# Patient Record
Sex: Male | Born: 1965 | Race: White | Hispanic: No | Marital: Single | Smoking: Never smoker
Health system: Southern US, Community
[De-identification: ages and names within clinical notes are randomized; demographics above are authoritative.]

## PROBLEM LIST (undated history)

## (undated) DIAGNOSIS — I10 Essential (primary) hypertension: Secondary | ICD-10-CM

## (undated) HISTORY — PX: MELANOMA EXCISION: SHX5266

---

## 2008-09-17 ENCOUNTER — Ambulatory Visit: Payer: Self-pay | Admitting: Internal Medicine

## 2013-02-27 ENCOUNTER — Emergency Department: Payer: Self-pay | Admitting: Emergency Medicine

## 2020-04-17 ENCOUNTER — Other Ambulatory Visit
Admission: RE | Admit: 2020-04-17 | Discharge: 2020-04-17 | Disposition: A | Discharge status: Home / Self Care | Payer: BC Managed Care – PPO | Source: Ambulatory Visit | Attending: Surgery | Admitting: Surgery

## 2020-04-17 ENCOUNTER — Other Ambulatory Visit: Payer: Self-pay | Admitting: Surgery

## 2020-04-17 ENCOUNTER — Ambulatory Visit (INDEPENDENT_AMBULATORY_CARE_PROVIDER_SITE_OTHER): Payer: BC Managed Care – PPO

## 2020-04-17 ENCOUNTER — Other Ambulatory Visit: Payer: Self-pay

## 2020-04-17 ENCOUNTER — Ambulatory Visit
Admission: EM | Admit: 2020-04-17 | Discharge: 2020-04-17 | Disposition: A | Discharge status: Home / Self Care | Payer: BC Managed Care – PPO | Attending: Family Medicine | Admitting: Family Medicine

## 2020-04-17 ENCOUNTER — Encounter: Payer: Self-pay | Admitting: Emergency Medicine

## 2020-04-17 DIAGNOSIS — Z79899 Other long term (current) drug therapy: Secondary | ICD-10-CM | POA: Diagnosis not present

## 2020-04-17 DIAGNOSIS — S82841A Displaced bimalleolar fracture of right lower leg, initial encounter for closed fracture: Secondary | ICD-10-CM | POA: Diagnosis not present

## 2020-04-17 DIAGNOSIS — Z01812 Encounter for preprocedural laboratory examination: Secondary | ICD-10-CM | POA: Insufficient documentation

## 2020-04-17 DIAGNOSIS — S82831A Other fracture of upper and lower end of right fibula, initial encounter for closed fracture: Secondary | ICD-10-CM | POA: Diagnosis present

## 2020-04-17 DIAGNOSIS — M25571 Pain in right ankle and joints of right foot: Secondary | ICD-10-CM

## 2020-04-17 DIAGNOSIS — Z20822 Contact with and (suspected) exposure to covid-19: Secondary | ICD-10-CM | POA: Insufficient documentation

## 2020-04-17 HISTORY — DX: Essential (primary) hypertension: I10

## 2020-04-17 LAB — SARS CORONAVIRUS 2 (TAT 6-24 HRS): SARS Coronavirus 2: NEGATIVE

## 2020-04-17 MED ORDER — HYDROCODONE-ACETAMINOPHEN 5-325 MG PO TABS: 1.0000 | ORAL_TABLET | Freq: Three times a day (TID) | ORAL | 0 refills | Status: AC | PRN
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
## 2020-04-17 NOTE — Discharge Instructions (Signed)
Rest. Elevation.  No weight bearing.  Call the Ortho office to get an appt (I spoke with one of the PA's).   Take care  [name]

## 2020-04-18 ENCOUNTER — Ambulatory Visit: Payer: BC Managed Care – PPO | Admitting: Anesthesiology

## 2020-04-18 ENCOUNTER — Ambulatory Visit: Payer: BC Managed Care – PPO

## 2020-04-18 ENCOUNTER — Encounter
Admission: RE | Disposition: A | Discharge status: Home / Self Care | Payer: Self-pay | Source: Home / Self Care | Attending: Surgery

## 2020-04-18 ENCOUNTER — Encounter: Payer: Self-pay | Admitting: Surgery

## 2020-04-18 ENCOUNTER — Other Ambulatory Visit: Payer: Self-pay

## 2020-04-18 ENCOUNTER — Ambulatory Visit
Admission: RE | Admit: 2020-04-18 | Discharge: 2020-04-18 | Disposition: A | Discharge status: Home / Self Care | Payer: BC Managed Care – PPO | Attending: Surgery | Admitting: Surgery

## 2020-04-18 ENCOUNTER — Ambulatory Visit: Payer: Self-pay

## 2020-04-18 DIAGNOSIS — Z79899 Other long term (current) drug therapy: Secondary | ICD-10-CM | POA: Insufficient documentation

## 2020-04-18 DIAGNOSIS — S82831A Other fracture of upper and lower end of right fibula, initial encounter for closed fracture: Secondary | ICD-10-CM | POA: Insufficient documentation

## 2020-04-18 DIAGNOSIS — G8918 Other acute postprocedural pain: Secondary | ICD-10-CM

## 2020-04-18 DIAGNOSIS — Z20822 Contact with and (suspected) exposure to covid-19: Secondary | ICD-10-CM | POA: Insufficient documentation

## 2020-04-18 DIAGNOSIS — Z419 Encounter for procedure for purposes other than remedying health state, unspecified: Secondary | ICD-10-CM

## 2020-04-18 HISTORY — PX: ORIF ANKLE FRACTURE: SHX5408

## 2020-04-18 LAB — BASIC METABOLIC PANEL
Anion gap: 7 (ref 5–15)
BUN: 14 mg/dL (ref 6–20)
CO2: 25 mmol/L (ref 22–32)
Calcium: 8.8 mg/dL — ABNORMAL LOW (ref 8.9–10.3)
Chloride: 102 mmol/L (ref 98–111)
Creatinine, Ser: 0.91 mg/dL (ref 0.61–1.24)
GFR, Estimated: >60 mL/min (ref >60)
Glucose, Bld: 114 mg/dL — ABNORMAL HIGH (ref 70–99)
Potassium: 3.8 mmol/L (ref 3.5–5.1)
Sodium: 134 mmol/L — ABNORMAL LOW (ref 135–145)

## 2020-04-18 SURGERY — OPEN REDUCTION INTERNAL FIXATION (ORIF) ANKLE FRACTURE
Anesthesia: General | Site: Ankle | Laterality: Right

## 2020-04-18 MED ORDER — HYDROCODONE-ACETAMINOPHEN 5-325 MG PO TABS: 1.0000 | ORAL_TABLET | ORAL | Status: DC | PRN

## 2020-04-18 MED ORDER — OXYCODONE HCL 5 MG/5ML PO SOLN: 5.0000 mg | Freq: Once | ORAL | Status: DC | PRN

## 2020-04-18 MED ORDER — BUPIVACAINE HCL 0.5 % IJ SOLN
INTRAMUSCULAR | Status: DC | PRN
  Administered 2020-04-18: 20 mL

## 2020-04-18 MED ORDER — MIDAZOLAM HCL 2 MG/2ML IJ SOLN
INTRAMUSCULAR | Status: AC
  Filled 2020-04-18: qty 2

## 2020-04-18 MED ORDER — MIDAZOLAM HCL 2 MG/2ML IJ SOLN
INTRAMUSCULAR | Status: AC
  Administered 2020-04-18: 2 mg via INTRAVENOUS
  Filled 2020-04-18: qty 2

## 2020-04-18 MED ORDER — OXYCODONE HCL 5 MG PO TABS: 5.0000 mg | ORAL_TABLET | Freq: Once | ORAL | Status: DC | PRN

## 2020-04-18 MED ORDER — PROPOFOL 10 MG/ML IV BOLUS
INTRAVENOUS | Status: AC
  Filled 2020-04-18: qty 20

## 2020-04-18 MED ORDER — FENTANYL CITRATE (PF) 100 MCG/2ML IJ SOLN
INTRAMUSCULAR | Status: AC
  Administered 2020-04-18: 100 ug via INTRAVENOUS
  Filled 2020-04-18: qty 2

## 2020-04-18 MED ORDER — MIDAZOLAM HCL 2 MG/2ML IJ SOLN: 2.0000 mg | Freq: Once | INTRAMUSCULAR | Status: AC

## 2020-04-18 MED ORDER — BUPIVACAINE HCL (PF) 0.5 % IJ SOLN
INTRAMUSCULAR | Status: AC
  Filled 2020-04-18: qty 30

## 2020-04-18 MED ORDER — ORAL CARE MOUTH RINSE: 15.0000 mL | Freq: Once | OROMUCOSAL | Status: AC

## 2020-04-18 MED ORDER — CEFAZOLIN SODIUM-DEXTROSE 2-4 GM/100ML-% IV SOLN
INTRAVENOUS | Status: AC
  Filled 2020-04-18: qty 100

## 2020-04-18 MED ORDER — MEPERIDINE HCL 50 MG/ML IJ SOLN: 6.2500 mg | INTRAMUSCULAR | Status: DC | PRN

## 2020-04-18 MED ORDER — METOCLOPRAMIDE HCL 5 MG/ML IJ SOLN: 5.0000 mg | Freq: Three times a day (TID) | INTRAMUSCULAR | Status: DC | PRN

## 2020-04-18 MED ORDER — ONDANSETRON HCL 4 MG/2ML IJ SOLN: 4.0000 mg | Freq: Four times a day (QID) | INTRAMUSCULAR | Status: DC | PRN

## 2020-04-18 MED ORDER — ONDANSETRON HCL 4 MG PO TABS: 4.0000 mg | ORAL_TABLET | Freq: Four times a day (QID) | ORAL | Status: DC | PRN

## 2020-04-18 MED ORDER — CHLORHEXIDINE GLUCONATE 0.12 % MT SOLN: 15.0000 mL | Freq: Once | OROMUCOSAL | Status: AC

## 2020-04-18 MED ORDER — CHLORHEXIDINE GLUCONATE 0.12 % MT SOLN
OROMUCOSAL | Status: AC
  Administered 2020-04-18: 15 mL via OROMUCOSAL
  Filled 2020-04-18: qty 15

## 2020-04-18 MED ORDER — DROPERIDOL 2.5 MG/ML IJ SOLN
0.6250 mg | Freq: Once | INTRAMUSCULAR | Status: DC | PRN
  Filled 2020-04-18: qty 2

## 2020-04-18 MED ORDER — PROMETHAZINE HCL 25 MG/ML IJ SOLN: 6.2500 mg | INTRAMUSCULAR | Status: DC | PRN

## 2020-04-18 MED ORDER — LACTATED RINGERS IV SOLN: INTRAVENOUS | Status: DC

## 2020-04-18 MED ORDER — LORAZEPAM 2 MG/ML IJ SOLN: 1.0000 mg | Freq: Once | INTRAMUSCULAR | Status: DC | PRN

## 2020-04-18 MED ORDER — LIDOCAINE HCL (CARDIAC) PF 100 MG/5ML IV SOSY
PREFILLED_SYRINGE | INTRAVENOUS | Status: DC | PRN
  Administered 2020-04-18: 100 mg via INTRAVENOUS

## 2020-04-18 MED ORDER — ONDANSETRON HCL 4 MG/2ML IJ SOLN
INTRAMUSCULAR | Status: DC | PRN
  Administered 2020-04-18: 4 mg via INTRAVENOUS

## 2020-04-18 MED ORDER — DEXTROSE 5 % IV SOLN
3.0000 g | INTRAVENOUS | Status: AC
  Administered 2020-04-18: 3 g via INTRAVENOUS
  Filled 2020-04-18: qty 3

## 2020-04-18 MED ORDER — HYDROMORPHONE HCL 1 MG/ML IJ SOLN: 0.2500 mg | INTRAMUSCULAR | Status: DC | PRN

## 2020-04-18 MED ORDER — FAMOTIDINE 20 MG PO TABS
ORAL_TABLET | ORAL | Status: AC
  Administered 2020-04-18: 20 mg via ORAL
  Filled 2020-04-18: qty 1

## 2020-04-18 MED ORDER — FENTANYL CITRATE (PF) 100 MCG/2ML IJ SOLN
INTRAMUSCULAR | Status: AC
  Filled 2020-04-18: qty 2

## 2020-04-18 MED ORDER — METOCLOPRAMIDE HCL 10 MG PO TABS: 5.0000 mg | ORAL_TABLET | Freq: Three times a day (TID) | ORAL | Status: DC | PRN

## 2020-04-18 MED ORDER — PROPOFOL 10 MG/ML IV BOLUS
INTRAVENOUS | Status: DC | PRN
  Administered 2020-04-18: 200 mg via INTRAVENOUS

## 2020-04-18 MED ORDER — POTASSIUM CHLORIDE IN NACL 20-0.9 MEQ/L-% IV SOLN
INTRAVENOUS | Status: DC
  Filled 2020-04-18 (×3): qty 1000

## 2020-04-18 MED ORDER — FENTANYL CITRATE (PF) 100 MCG/2ML IJ SOLN: 100.0000 ug | Freq: Once | INTRAMUSCULAR | Status: AC

## 2020-04-18 MED ORDER — ACETAMINOPHEN 10 MG/ML IV SOLN
INTRAVENOUS | Status: AC
  Filled 2020-04-18: qty 100

## 2020-04-18 MED ORDER — FAMOTIDINE 20 MG PO TABS: 20.0000 mg | ORAL_TABLET | Freq: Once | ORAL | Status: AC

## 2020-04-18 MED ORDER — ACETAMINOPHEN 10 MG/ML IV SOLN
INTRAVENOUS | Status: DC | PRN
  Administered 2020-04-18: 1000 mg via INTRAVENOUS

## 2020-04-18 MED ORDER — ROPIVACAINE HCL 5 MG/ML IJ SOLN
INTRAMUSCULAR | Status: AC
  Filled 2020-04-18: qty 60

## 2020-04-18 SURGICAL SUPPLY — 60 items
2.2mm square driver ×1 IMPLANT
APL PRP STRL LF DISP 70% ISPRP (MISCELLANEOUS) ×2
BIT DRILL 2.5X2.75 QC CALB (BIT) ×1 IMPLANT
BIT DRILL 3.5X5.5 QC CALB (BIT) ×1 IMPLANT
BLADE SURG SZ10 CARB STEEL (BLADE) ×4 IMPLANT
BNDG COHESIVE 4X5 TAN STRL (GAUZE/BANDAGES/DRESSINGS) ×2 IMPLANT
BNDG ELASTIC 4X5.8 VLCR STR LF (GAUZE/BANDAGES/DRESSINGS) ×4 IMPLANT
BNDG ELASTIC 6X5.8 VLCR STR LF (GAUZE/BANDAGES/DRESSINGS) ×2 IMPLANT
BNDG ESMARK 6X12 TAN STRL LF (GAUZE/BANDAGES/DRESSINGS) ×2 IMPLANT
BNDG PLASTER FAST 4X5 WHT LF (CAST SUPPLIES) ×8 IMPLANT
BNDG PLSTR 5X4 FST ST WHT LF (CAST SUPPLIES) ×4
CANISTER SUCT 1200ML W/VALVE (MISCELLANEOUS) ×2 IMPLANT
CHLORAPREP W/TINT 26 (MISCELLANEOUS) ×4 IMPLANT
COVER WAND RF STERILE (DRAPES) ×2 IMPLANT
CUFF TOURN SGL QUICK 24 (TOURNIQUET CUFF) ×2
CUFF TOURN SGL QUICK 30 (TOURNIQUET CUFF)
CUFF TRNQT CYL 24X4X16.5-23 (TOURNIQUET CUFF) IMPLANT
CUFF TRNQT CYL 30X4X21-28X (TOURNIQUET CUFF) IMPLANT
DRAPE C-ARM XRAY 36X54 (DRAPES) ×2 IMPLANT
DRAPE C-ARMOR (DRAPES) ×2 IMPLANT
DRAPE INCISE IOBAN 66X45 STRL (DRAPES) ×2 IMPLANT
DRAPE SPLIT 6X30 W/TAPE (DRAPES) ×2 IMPLANT
DRAPE U-SHAPE 47X51 STRL (DRAPES) ×2 IMPLANT
DRIVER SQUARE 2.2 DISP (INSTRUMENTS) ×1 IMPLANT
ELECT CAUTERY BLADE 6.4 (BLADE) ×2 IMPLANT
ELECT REM PT RETURN 9FT ADLT (ELECTROSURGICAL) ×2
ELECTRODE REM PT RTRN 9FT ADLT (ELECTROSURGICAL) ×1 IMPLANT
GAUZE SPONGE 4X4 12PLY STRL (GAUZE/BANDAGES/DRESSINGS) ×2 IMPLANT
GAUZE XEROFORM 1X8 LF (GAUZE/BANDAGES/DRESSINGS) ×2 IMPLANT
GLOVE BIO SURGEON STRL SZ8 (GLOVE) ×4 IMPLANT
GLOVE INDICATOR 8.0 STRL GRN (GLOVE) ×2 IMPLANT
GOWN STRL REUS W/ TWL LRG LVL3 (GOWN DISPOSABLE) ×1 IMPLANT
GOWN STRL REUS W/ TWL XL LVL3 (GOWN DISPOSABLE) ×1 IMPLANT
GOWN STRL REUS W/TWL LRG LVL3 (GOWN DISPOSABLE) ×2
GOWN STRL REUS W/TWL XL LVL3 (GOWN DISPOSABLE) ×2
HEMOVAC 400ML (MISCELLANEOUS)
KIT DRAIN HEMOVAC JP 7FR 400ML (MISCELLANEOUS) ×1 IMPLANT
KIT TURNOVER KIT A (KITS) ×2 IMPLANT
LABEL OR SOLS (LABEL) ×2 IMPLANT
MANIFOLD NEPTUNE II (INSTRUMENTS) ×2 IMPLANT
NS IRRIG 1000ML POUR BTL (IV SOLUTION) ×2 IMPLANT
PACK EXTREMITY ARMC (MISCELLANEOUS) ×2 IMPLANT
PAD ABD DERMACEA PRESS 5X9 (GAUZE/BANDAGES/DRESSINGS) ×4 IMPLANT
PAD CAST CTTN 4X4 STRL (SOFTGOODS) ×2 IMPLANT
PAD PREP 24X41 OB/GYN DISP (PERSONAL CARE ITEMS) ×2 IMPLANT
PADDING CAST COTTON 4X4 STRL (SOFTGOODS) ×4
PLATE COMPOSITE 7HOLE (Plate) ×1 IMPLANT
SCREW CORTICAL 3.5MM 24MM (Screw) ×1 IMPLANT
SCREW CORTICAL LOW PROF 3.5X20 (Screw) ×1 IMPLANT
SCREW LOCK CORT STAR 3.5X14 (Screw) ×1 IMPLANT
SCREW LOCK CORT STAR 3.5X16 (Screw) ×2 IMPLANT
SCREW LOW PROFILE 18MMX3.5MM (Screw) ×1 IMPLANT
SCREW NON LOCKING LP 3.5 16MM (Screw) ×2 IMPLANT
SPONGE LAP 18X18 RF (DISPOSABLE) ×2 IMPLANT
STAPLER SKIN PROX 35W (STAPLE) ×2 IMPLANT
STOCKINETTE IMPERV 14X48 (MISCELLANEOUS) ×2 IMPLANT
SUT VIC AB 0 CT1 36 (SUTURE) ×2 IMPLANT
SUT VIC AB 2-0 SH 27 (SUTURE) ×4
SUT VIC AB 2-0 SH 27XBRD (SUTURE) ×2 IMPLANT
SYR 10ML LL (SYRINGE) ×2 IMPLANT

## 2020-04-18 NOTE — Anesthesia Procedure Notes (Signed)
Procedure Name: LMA Insertion Date/Time: 04/18/2020 12:08 PM Performed by: Rodney Booze, CRNA Pre-anesthesia Checklist: Patient identified, Emergency Drugs available, Suction available, Patient being monitored and Timeout performed Patient Re-evaluated:Patient Re-evaluated prior to induction Oxygen Delivery Method: Circle system utilized Preoxygenation: Pre-oxygenation with 100% oxygen Induction Type: IV induction LMA: LMA inserted LMA Size: 5.0 Number of attempts: 1 Tube secured with: Tape Dental Injury: Teeth and Oropharynx as per pre-operative assessment

## 2020-04-18 NOTE — Anesthesia Procedure Notes (Addendum)
Anesthesia Regional Block: Popliteal block   Pre-Anesthetic Checklist: ,, timeout performed, Correct Patient, Correct Site, Correct Laterality, Correct Procedure, Correct Position, site marked, Risks and benefits discussed,  Surgical consent,  Pre-op evaluation,  At surgeon's request and post-op pain management  Laterality: Right  Prep: chloraprep       Needles:  Injection technique: Single-shot  Needle Type: Stimiplex     Needle Length: 5cm  Needle Gauge: 20     Additional Needles:   Procedures:,,,, ultrasound used (permanent image in chart),,,,  Narrative:  Start time: 04/18/2020 11:55 AM End time: 04/18/2020 11:57 AM  Performed by: Personally  Anesthesiologist: Emilio Math, DO  Additional Notes: Sedation with versed 2mg  Iv and Fentanyl IV Block- Ropi 0.5% 60ml

## 2020-04-18 NOTE — Anesthesia Postprocedure Evaluation (Signed)
Anesthesia Post Note  Patient: Jason Mayer  Procedure(s) Performed: OPEN REDUCTION INTERNAL FIXATION (ORIF) BIMALLEOLAR ANKLE FRACTURE (Right Ankle)  Patient location during evaluation: PACU Anesthesia Type: General Level of consciousness: awake Pain management: pain level controlled Vital Signs Assessment: post-procedure vital signs reviewed and stable Respiratory status: spontaneous breathing Cardiovascular status: stable Postop Assessment: no apparent nausea or vomiting Anesthetic complications: no   No complications documented.   Last Vitals:  Vitals:   04/18/20 1157 04/18/20 1341  BP: (!) 144/83   Pulse: 85   Resp: 15   Temp:  (!) 36.1 C  SpO2: 97%     Last Pain:  Vitals:   04/18/20 1341  TempSrc:   PainSc: 0-No pain                 Emilio Math
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
## 2020-04-18 NOTE — Anesthesia Preprocedure Evaluation (Signed)
Anesthesia Evaluation  Patient identified by MRN, date of birth, ID band Patient awake    Reviewed: Allergy & Precautions, H&P , NPO status , Patient's Chart, lab work & pertinent test results  Airway Mallampati: II       Dental  (+)    Pulmonary neg pulmonary ROS,    Pulmonary exam normal breath sounds clear to auscultation       Cardiovascular hypertension, negative cardio ROS Normal cardiovascular exam Rhythm:Regular Rate:Normal     Neuro/Psych negative neurological ROS  negative psych ROS   GI/Hepatic negative GI ROS, Neg liver ROS,   Endo/Other  negative endocrine ROS  Renal/GU negative Renal ROS  negative genitourinary   Musculoskeletal negative musculoskeletal ROS (+)   Abdominal   Peds negative pediatric ROS (+)  Hematology negative hematology ROS (+)   Anesthesia Other Findings Past Medical History: No date: Hypertension   Reproductive/Obstetrics negative OB ROS                             Anesthesia Physical Anesthesia Plan  ASA: II  Anesthesia Plan: General   Post-op Pain Management:  Regional for Post-op pain   Induction: Intravenous  PONV Risk Score and Plan: 2 and Ondansetron and Dexamethasone  Airway Management Planned: LMA  Additional Equipment:   Intra-op Plan:   Post-operative Plan: Extubation in OR  Informed Consent: I have reviewed the patients History and Physical, chart, labs and discussed the procedure including the risks, benefits and alternatives for the proposed anesthesia with the patient or authorized representative who has indicated his/her understanding and acceptance.     Dental advisory given  Plan Discussed with: CRNA, Anesthesiologist and Surgeon  Anesthesia Plan Comments:         Anesthesia Quick Evaluation

## 2020-04-18 NOTE — Anesthesia Procedure Notes (Addendum)
Anesthesia Regional Block: Adductor canal block   Pre-Anesthetic Checklist: ,, timeout performed, Correct Patient, Correct Site, Correct Laterality, Correct Procedure, Correct Position, site marked, Risks and benefits discussed,  Surgical consent,  Pre-op evaluation,  At surgeon's request and post-op pain management  Laterality: Right  Prep: chloraprep       Needles:  Injection technique: Single-shot  Needle Type: Stimiplex     Needle Length: 5cm  Needle Gauge: 20     Additional Needles:   Procedures:,,,, ultrasound used (permanent image in chart),,,,  Narrative:  Start time: 04/18/2020 11:58 AM End time: 04/18/2020 11:59 AM  Performed by: Personally  Anesthesiologist: Emilio Math, DO  Additional Notes: ropi 0.5% 70ml

## 2020-04-18 NOTE — Transfer of Care (Signed)
Immediate Anesthesia Transfer of Care Note  Patient: Jason Mayer  Procedure(s) Performed: OPEN REDUCTION INTERNAL FIXATION (ORIF) BIMALLEOLAR ANKLE FRACTURE (Right Ankle)  Patient Location: PACU  Anesthesia Type:General and Regional  Level of Consciousness: awake, alert  and oriented  Airway & Oxygen Therapy: Patient Spontanous Breathing and Patient connected to nasal cannula oxygen  Post-op Assessment: Report given to RN and Post -op Vital signs reviewed and stable  Post vital signs: stable  Last Vitals:  Vitals Value Taken Time  BP 159/87 04/18/20 1342  Temp    Pulse 85 04/18/20 1345  Resp 17 04/18/20 1345  SpO2 97 % 04/18/20 1345  Vitals shown include unvalidated device data.  Last Pain:  Vitals:   04/18/20 1157  TempSrc:   PainSc: 0-No pain         Complications: No complications documented.

## 2020-04-18 NOTE — Discharge Instructions (Addendum)
    AMBULATORY SURGERY  DISCHARGE INSTRUCTIONS   1) The drugs that you were given will stay in your system until tomorrow so for the next 24 hours you should not:  A) Drive an automobile B) Make any legal decisions C) Drink any alcoholic beverage   2) You may resume regular meals tomorrow.  Today it is better to start with liquids and gradually work up to solid foods.  You may eat anything you prefer, but it is better to start with liquids, then soup and crackers, and gradually work up to solid foods.   3) Please notify your doctor immediately if you have any unusual bleeding, trouble breathing, redness and pain at the surgery site, drainage, fever, or pain not relieved by medication.    4) Additional Instructions:        Please contact your physician with any problems or Same Day Surgery at [phone], Monday through Friday 6 am to 4 pm, or [place] at [facility] number at [phone]. Orthopedic discharge instructions: Keep splint dry and intact.  Keep leg elevated above heart level and apply ice frequently to ankle. Take Aleve 2 tabs BID OR ibuprofen 600-800 mg TID with meals for 7-10 days, then as necessary. Take hydrocodone as prescribed when needed.  May supplement with ES Tylenol if necessary. No weightbearing on right foot - use walker or crutches for ambulation. Follow-up in 10-14 days or as scheduled.
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
## 2020-04-20 ENCOUNTER — Encounter: Payer: Self-pay | Admitting: Surgery

## 2022-04-07 ENCOUNTER — Ambulatory Visit (INDEPENDENT_AMBULATORY_CARE_PROVIDER_SITE_OTHER): Payer: BC Managed Care – PPO

## 2022-04-07 ENCOUNTER — Encounter: Payer: Self-pay | Admitting: Emergency Medicine

## 2022-04-07 ENCOUNTER — Emergency Department
Admission: EM | Admit: 2022-04-07 | Discharge: 2022-04-08 | Disposition: E | Payer: BC Managed Care – PPO | Attending: Emergency Medicine | Admitting: Emergency Medicine

## 2022-04-07 ENCOUNTER — Ambulatory Visit (INDEPENDENT_AMBULATORY_CARE_PROVIDER_SITE_OTHER)
Admission: EM | Admit: 2022-04-07 | Discharge: 2022-04-07 | Disposition: A | Discharge status: Other Acute Inpatient Hospital | Payer: BC Managed Care – PPO | Source: Home / Self Care

## 2022-04-07 DIAGNOSIS — I10 Essential (primary) hypertension: Secondary | ICD-10-CM | POA: Diagnosis not present

## 2022-04-07 DIAGNOSIS — R9431 Abnormal electrocardiogram [ECG] [EKG]: Secondary | ICD-10-CM | POA: Insufficient documentation

## 2022-04-07 DIAGNOSIS — R0603 Acute respiratory distress: Secondary | ICD-10-CM | POA: Diagnosis not present

## 2022-04-07 DIAGNOSIS — Z1152 Encounter for screening for COVID-19: Secondary | ICD-10-CM | POA: Insufficient documentation

## 2022-04-07 DIAGNOSIS — R0602 Shortness of breath: Secondary | ICD-10-CM | POA: Diagnosis present

## 2022-04-07 DIAGNOSIS — R7309 Other abnormal glucose: Secondary | ICD-10-CM | POA: Diagnosis not present

## 2022-04-07 DIAGNOSIS — I469 Cardiac arrest, cause unspecified: Secondary | ICD-10-CM | POA: Insufficient documentation

## 2022-04-07 LAB — CBG MONITORING, ED: Glucose-Capillary: 108 mg/dL — ABNORMAL HIGH (ref 70–99)

## 2022-04-07 LAB — SARS CORONAVIRUS 2 BY RT PCR: SARS Coronavirus 2 by RT PCR: NEGATIVE

## 2022-04-07 MED ORDER — ALBUTEROL SULFATE (2.5 MG/3ML) 0.083% IN NEBU
2.5000 mg | INHALATION_SOLUTION | Freq: Once | RESPIRATORY_TRACT | Status: AC
  Administered 2022-04-07: 2.5 mg via RESPIRATORY_TRACT

## 2022-04-07 MED ORDER — ASPIRIN 81 MG PO CHEW
324.0000 mg | CHEWABLE_TABLET | Freq: Once | ORAL | Status: AC
  Administered 2022-04-07: 324 mg via ORAL

## 2022-04-08 NOTE — ED Notes (Addendum)
Patient is being discharged from the Urgent Care and sent to the Emergency Department via EMS . Per Becky Augusta NP, patient is in need of higher level of care due to SOB, Dizziness, Blurred Vision, EKG changes. Patient is aware and verbalizes understanding of plan of care.  Vitals:   04-17-2022 0908  BP: 115/72  Pulse: (!) 128  Resp: (!) 26  Temp: 97.6 F (36.4 C)  SpO2: 90%

## 2022-04-08 NOTE — ED Notes (Signed)
[place] contacted
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
## 2022-04-08 NOTE — ED Provider Notes (Signed)
[facility]    CSN: [id number] Arrival date & time: 04/14/2022  0844      History   Chief Complaint Chief Complaint  Patient presents with   Shortness of Breath    HPI Jason Mayer is a 57 y.o. male.   HPI  57 year old male here for evaluation of shortness of breath.  The patient reports that all week he has been experiencing fever, sweats, and fatigue.  He has had some intermittent chest pain with deep breathing but is not having that at present.  He also do not have it at rest.  He is also having some mild pain in his upper abdomen.  He denies any runny nose, nasal congestion, ear pain, sore throat, or cough.  He has no history of smoking or blood clots.  He also does not report any recent long travel.  Patient is moderately dyspneic and tachypneic in the exam room with an increased work of breathing.  He was 88 to 90% in triage and was put on 2 L nasal cannula by the nurses.  This improved to sat to 91%.  Past Medical History:  Diagnosis Date   Hypertension     There are no problems to display for this patient.   Past Surgical History:  Procedure Laterality Date   MELANOMA EXCISION     ORIF ANKLE FRACTURE Right 04/18/2020   Procedure: OPEN REDUCTION INTERNAL FIXATION (ORIF) BIMALLEOLAR ANKLE FRACTURE;  Surgeon: Christena Flake, MD;  Location: [facility] ORS;  Service: Orthopedics;  Laterality: Right;       Home Medications    Prior to Admission medications   Medication Sig Start Date End Date Taking? Authorizing Provider  losartan (COZAAR) 25 MG tablet Take 25 mg by mouth daily. 09/14/19  Yes [provider]  rosuvastatin (CRESTOR) 5 MG tablet Take by mouth. 02/13/22 02/13/23 Yes [provider]  HYDROcodone-acetaminophen (NORCO/VICODIN) 5-325 MG tablet Take 1 tablet by mouth every 8 (eight) hours as needed for moderate pain or severe pain. 04/17/20   [name], DO  indomethacin (INDOCIN) 50 MG capsule Take 50 mg by mouth 2 (two) times daily as  needed (gout). 03/17/20   [provider]  naproxen sodium (ALEVE) 220 MG tablet Take 220-440 mg by mouth daily as needed (pain.).    [provider]    Family History History reviewed. No pertinent family history.  Social History Social History   Tobacco Use   Smoking status: Never   Smokeless tobacco: Never  Vaping Use   Vaping Use: Never used  Substance Use Topics   Alcohol use: Yes    Comment: occassional   Drug use: Not Currently     Allergies   Patient has no known allergies.   Review of Systems Review of Systems  Constitutional:  Positive for chills and fever.  HENT:  Negative for congestion, ear pain, rhinorrhea and sore throat.   Eyes:  Positive for visual disturbance.       Patient reported some mildly blurry vision that started in the exam room.  Respiratory:  Positive for shortness of breath. Negative for cough and wheezing.   Cardiovascular:  Positive for chest pain.       With deep breathing.  Not present currently.  Gastrointestinal:  Positive for abdominal pain.     Physical Exam Triage Vital Signs ED Triage Vitals  Enc Vitals Group     BP 2022-04-14 0908 115/72     Pulse Rate Apr 14, 2022 0908 (!) 128  Resp 05-May-2022 0908 (!) 26     Temp 05-05-22 0908 97.6 F (36.4 C)     Temp Source May 05, 2022 0908 Temporal     SpO2 2022-05-05 0908 90 %     Weight May 05, 2022 0907 264 lb 15.9 oz (120.2 kg)     Height 05/05/22 0907 6\' 3"  (1.905 m)     Head Circumference --      Peak Flow --      Pain Score 05/05/22 0907 0     Pain Loc --      Pain Edu? --      Excl. in GC? --    No data found.  Updated Vital Signs BP 115/72 (BP Location: Left Arm)   Pulse (!) 128   Temp 97.6 F (36.4 C) (Temporal)   Resp (!) 26   Ht 6\' 3"  (1.905 m)   Wt 264 lb 15.9 oz (120.2 kg)   SpO2 90%   BMI 33.12 kg/m   Visual Acuity Right Eye Distance:   Left Eye Distance:   Bilateral Distance:    Right Eye Near:   Left Eye Near:    Bilateral Near:      Physical Exam Vitals (Patient is mildly tachypneic and dyspneic in the exam room.  2 L of oxygen were in place.  Increased to 4 L by this provider.) and nursing note reviewed.  Constitutional:      General: He is in acute distress.     Appearance: Normal appearance. He is ill-appearing.  HENT:     Head: Normocephalic and atraumatic.     Right Ear: There is impacted cerumen.     Left Ear: There is impacted cerumen.     Ears:     Comments: Mild cerumen impaction bilaterally with dried hard wax.    Nose: Congestion present. No rhinorrhea.     Comments: His mucosa flex erythema and edema but no rhinorrhea.    Mouth/Throat:     Mouth: Mucous membranes are moist.     Pharynx: Oropharynx is clear. No oropharyngeal exudate or posterior oropharyngeal erythema.  Cardiovascular:     Rate and Rhythm: Regular rhythm. Tachycardia present.     Pulses: Normal pulses.     Heart sounds: Normal heart sounds. No murmur heard.    No friction rub. No gallop.  Pulmonary:     Effort: Respiratory distress present.     Breath sounds: No wheezing, rhonchi or rales.     Comments: Patient is dyspneic and tachypneic but his lungs are clear in all fields. Musculoskeletal:     Cervical back: Normal range of motion and neck supple.  Lymphadenopathy:     Cervical: No cervical adenopathy.  Skin:    General: Skin is warm and dry.     Capillary Refill: Capillary refill takes less than 2 seconds.     Findings: No rash.  Neurological:     General: No focal deficit present.     Mental Status: He is alert and oriented to person, place, and time.  Psychiatric:        Mood and Affect: Mood normal.        Behavior: Behavior normal.        Thought Content: Thought content normal.        Judgment: Judgment normal.      UC Treatments / Results  Labs (all labs ordered are listed, but only abnormal results are displayed) Labs Reviewed  SARS CORONAVIRUS 2 BY RT PCR    EKG Sinus  tachycardia with occasional  PVCs and a ventricular rate of 131 bpm PR interval 132 ms QRS duration 116 ms QT/QTc 378/5 and 58 ms Incomplete right bundle branch block with ST segment elevation in 3 and aVF.  Questionable reciprocal changes in V1 and V2.  Difficult to tell due to wavy baseline. *Change when compared to EKG from 04/18/2020 which showed normal sinus rhythm without abnormality.   Radiology DG Chest 2 View  Result Date: 10/31/2021 CLINICAL DATA:  Shortness of breath. EXAM: CHEST - 2 VIEW COMPARISON:  None Available. FINDINGS: The cardiomediastinal silhouette is unremarkable. Mild peribronchial thickening noted which may be chronic. Probable minimal scarring/atelectasis at the LATERAL LEFT lung base is noted. There is no evidence of focal airspace disease, pulmonary edema, suspicious pulmonary nodule/mass, pleural effusion, or pneumothorax. No acute bony abnormalities are identified. Surgical clips within the LEFT LOWER neck/UPPER chest identified. IMPRESSION: Mild peribronchial thickening which may be chronic. Probable minimal scarring/atelectasis at the LATERAL LEFT lung base. Electronically Signed   By: Harmon PierJeffrey  Hu M.D.   On: 10/31/2021 09:49    Procedures Procedures (including critical care time)  Medications Ordered in UC Medications  albuterol (PROVENTIL) (2.5 MG/3ML) 0.083% nebulizer solution 2.5 mg (2.5 mg Nebulization Given 09-Jan-2022 0951)  aspirin chewable tablet 324 mg (324 mg Oral Given 09-Jan-2022 1005)    Initial Impression / Assessment and Plan / UC Course  I have reviewed the triage vital signs and the nursing notes.  Pertinent labs & imaging results that were available during my care of the patient were reviewed by me and considered in my medical decision making (see chart for details).   Patient is an ill-appearing [age] old male who is in moderate respiratory distress presenting for evaluation of a weeks worth of cold symptoms which she describes as fever and sweats with the development of  shortness of breath this morning.  He states he feels like he cannot breathe and cannot catch his breath.  His oxygen saturation in triage was 88 to 90% and he was started on 2 L by the triage nurse.  When I entered the room he was only 91% on 2 L so I increased it to 4 L by nasal cannula.  His exam reveals some mild nasal congestion but no rhinorrhea or postnasal drip.  He is dyspneic and tachypneic but his lungs are clear to auscultation in all fields.  I will order a COVID swab and chest x-ray as well as EKG and albuterol nebulizer treatment to see if his breathing improves.  I have advised the patient that if his symptoms do not improve, or he worsens, he will need to go to the ER for evaluation.  Radiology impression of chest x-ray states that there is mild peribronchial thickening which may be chronic.  Probable minimal scarring/atelectasis at the left lateral lung base.  Upon clarification with the patient he states that the shortness of breath woke him up out of sleep at 5 AM.  No chest pain and he still not having any chest pain at this time.  Patient reports that he did not receive any relief of his shortness of breath with the albuterol nebulizer treatment.  He is pale, continues to be dizzy, and is complaining of blurred vision.  He also remains dyspneic and tachypneic despite 4 L of O2.  His EKG shows sinus tachycardia with occasional PVCs.  He has a wandering baseline so it is difficult to assess for ST changes but there does appear to be some  depression in 3 and aVF.  Lead II is indistinguishable.  He does have some possible depression in V1 and V2 as well and possible elevation in lead III.  I am can order 324 baby aspirin and an IV to be placed.  I have discussed with the patient that I believe his shortness of breath and symptoms may be coming from his heart and not related to his lungs.  We will call the ambulance and have him transported to the emergency department for further  evaluation.  Report to [facility], the charge nurse in the ER at [facility].  COVID PCR is negative.  Report given to first responders from [facility] fire department and care transferred.  Family is at bedside and I have updated them as to the findings of today's visit as well as the plan of care.    Final Clinical Impressions(s) / UC Diagnoses   Final diagnoses:  Acute respiratory distress  Nonspecific abnormal electrocardiogram (ECG) (EKG)     Discharge Instructions      Please go to the ER for evaluation of your shortness of breath.  EMS will transport you.     ED Prescriptions   None    PDMP not reviewed this encounter.   Becky Augusta, NP Apr 24, 2022 1017

## 2022-04-08 NOTE — ED Provider Notes (Signed)
[facility] Provider Note    Event Date/Time   First MD Initiated Contact with Patient 2022-04-28 1100     (approximate)   History   Chief Complaint: shortness of breath  HPI  Jason Mayer is a 57 y.o. male with a history of hyperlipidemia and hypertension who is brought to the ED from urgent care by EMS due to respiratory distress.  Patient had presented to urgent care reporting fever, sweats, fatigue, intermittent pleuritic chest pain over the past week.  He was noted to be in respiratory distress and requiring supplemental oxygen, and EMS was called to bring the patient to the emergency department.  During transport by EMS, they report he had progressively worsening respiratory distress.  They initiated CPAP, and patient subsequently developed respiratory arrest and cardiac arrest at 10:40am.  Initial rhythm was ventricular tachycardia.  He received 1 electrocardioversion attempt, 300 mg bolus of amiodarone, with subsequent conversion to PEA.  He received 2 mg of epinephrine prior to arrival in the ED.     Physical Exam   Triage Vital Signs: ED Triage Vitals  Enc Vitals Group     BP      Pulse      Resp      Temp      Temp src      SpO2      Weight      Height      Head Circumference      Peak Flow      Pain Score      Pain Loc      Pain Edu?      Excl. in GC?     Most recent vital signs: Vitals:   04-28-2022 1147  Pulse: (!) 0  Resp: (!) 0    General: GCS 3.  Pupils fixed and dilated bilaterally CV:  Absent heart sounds, no peripheral pulses Resp:  BVM ventilations on arrival, no spontaneous respirations.  Bilateral basilar crackles Abd:  No distention. Soft, no ecchymosis Other:  No LE edema, no signs of trauma   ED Results / Procedures / Treatments   Labs (all labs ordered are listed, but only abnormal results are displayed) Labs Reviewed  CBG MONITORING, ED - Abnormal; Notable for the following components:      Result Value    Glucose-Capillary 108 (*)    All other components within normal limits     EKG    RADIOLOGY    PROCEDURES:  .Critical Care  Performed by: Sharman Cheek, MD Authorized by: Sharman Cheek, MD   Critical care provider statement:    Critical care time (minutes):  33   Critical care time was exclusive of:  Separately billable procedures and treating other patients   Critical care was necessary to treat or prevent imminent or life-threatening deterioration of the following conditions:  Respiratory failure, cardiac failure, circulatory failure and CNS failure or compromise   Critical care was time spent personally by me on the following activities:  Development of treatment plan with patient or surrogate, discussions with consultants, evaluation of patient's response to treatment, examination of patient, obtaining history from patient or surrogate, ordering and performing treatments and interventions, ordering and review of laboratory studies, ordering and review of radiographic studies, pulse oximetry, re-evaluation of patient's condition and review of old charts Comments:         .1-3 Lead EKG Interpretation  Performed by: Sharman Cheek, MD Authorized by: Sharman Cheek, MD     Interpretation: abnormal  Rhythm: asystole   Comments:        CPR  Date/Time: 05-02-22 12:01 PM  Performed by: Sharman Cheek, MD Authorized by: Sharman Cheek, MD  CPR Procedure Details:      Amount of time prior to administration of ACLS/BLS (minutes):  0   ACLS/BLS initiated by EMS: Yes     CPR/ACLS performed in the ED: Yes     Duration of CPR (minutes):  35   Outcome: Pt declared dead    CPR performed via ACLS guidelines under my direct supervision.  See RN documentation for details including defibrillator use, medications, doses and timing. Comments:        Procedure Name: Intubation Date/Time: May 02, 2022 12:02 PM  Performed by: Sharman Cheek,  MDPre-anesthesia Checklist: Patient identified, Emergency Drugs available, Suction available and Patient being monitored Oxygen Delivery Method: Ambu bag Preoxygenation: Pre-oxygenation with 100% oxygen Ventilation: Mask ventilation without difficulty Laryngoscope Size: Glidescope and 3 Grade View: Grade I Tube size: 7.5 mm Number of attempts: 1 Airway Equipment and Method: Video-laryngoscopy Placement Confirmation: ETT inserted through vocal cords under direct vision, CO2 detector and Breath sounds checked- equal and bilateral Secured at: 24 cm Tube secured with: ETT holder Dental Injury: Teeth and Oropharynx as per pre-operative assessment        MEDICATIONS ORDERED IN ED: Medications - No data to display   IMPRESSION / MDM / ASSESSMENT AND PLAN / ED COURSE  I reviewed the triage vital signs and the nursing notes.                              Differential diagnosis includes, but is not limited to, ACS, PE, dissection, flash pulmonary edema, acute heart failure/cardiogenic shock, kidney failure  Patient's presentation is most consistent with acute presentation with potential threat to life or bodily function.  Patient presents in cardiopulmonary arrest, receiving CPR and BVM ventilations on arrival.  Initial exam shows GCS of 3, pupils fixed and dilated bilaterally.  See code documentation for further details.  In short, patient had continuous CPR with good peripheral pulse with Lucas compressions.  Intubated with good breath sounds bilaterally, manually bagged with Peep valve on 15 for alveolar recruitment.  Received 5 mg of epinephrine, 1 amp of bicarb, 1 amp of calcium chloride in the ED.  Despite all this, patient was persistently in asystole throughout his time in the ED.  At 1134, resuscitation efforts were terminated.  Patient's long-term partner was notified in the family room.  Case discussed with ME Tenna Delaine who advises that this would not be a medical examiner  case.  Patient has a PCP, Dr. Maryjane Hurter who can complete death certificate for the patient.  At this point, I suspect ACS causing acute heart failure/cardiogenic shock as the cause of the patient's death today.       FINAL CLINICAL IMPRESSION(S) / ED DIAGNOSES   Final diagnoses:  Cardiac arrest [facility])     Rx / DC Orders   ED Discharge Orders     None        Note:  This document was prepared using Dragon voice recognition software and may include unintentional dictation errors.   Sharman Cheek, MD 2022-05-02 1202

## 2022-04-08 NOTE — ED Notes (Signed)
Upon arrival with EMS, CPR in progress. X 1 PIV in Left AC.  Pt apnic, pulseless with PEA on the monitor

## 2022-04-08 NOTE — ED Triage Notes (Signed)
Patient c/o cold symptoms for a week.  Patient reports SOB that started this morning.

## 2022-04-08 NOTE — Discharge Instructions (Signed)
Please go to the ER for evaluation of your shortness of breath.  EMS will transport you.

## 2022-04-08 NOTE — ED Notes (Signed)
Pt to Rm 15 with EMS. CPR in progress. EMS started CPR at 1040.  PIV left A/C. Pt being ventilated with BVM.  Upon exam pt pulseless, anpic, PEA on monitor. Pupils Fixed and dilated.  Pt moved to unit stretcher, EMS IV got tangled and pulled out of Left AC.. 1101 Lucas applied and started. 1102 Pt intubated by Dr. Barton Fanny. 7.5 mm ETT at 24 cm to lips. Good bilateral breath sounds. (+) colormetric EtCo2 change. Secured with a commercial device by Respiratory. 1102. I/O initiated Right tibia 1103 Epinephrine 1 mg given. Right I/O  1104  IV access 20 g right FA 1105  IV access 20 g left AC 1106 Pulse and rhythm check. No pulses pt in PEA. CPR continues. 1107 Epinephrine 1 mg given Right PIV 1109 Pulse and rhythm check. No pulses and Pt in asystole. CPR continued 1109 Epinephrine 1 mg given Right PIV 1112 Pulse and Rhythm check. No pulses and asystole CPR continued 1112 Calcium chloride 1 gr, Sodium Bicarb 50 Meq given IV 1115 Pulse and Rhythm checks. No pulses and pt in Asystole. CPR continued 1115 Epinephrine 1 mg given Right PIV 1118 Pulse and Rhythm check. No pulses and Pt in Asystole. CPR continued. 1123 Pulse and Rhythm check. No pulses and Pt in Asystole. CPR continued. 1126 Epinephrine 1 mg given Right PIV 1129 Pulse and Rhythm check. No pulses and Pt in Asystole. CPR continued. 1133 Pulse and Rhythm check. No pulses and Pt in Asystole. CPR discontinued 1134 TOD

## 2022-04-08 NOTE — Progress Notes (Signed)
Called to provide support to family after death of the patient. Met with ( life partner), as MD notified her of his death. 57 yr old son and additional family members were present. Escorted partner and then son bedside. Funeral release for was updated with contact information of next of kin.

## 2022-04-08 DEATH — deceased

## 2022-08-19 IMAGING — XA DG ANKLE 2V *R*
3 series · 3 of 3 positions shown · non-contrast
Comparison: April 17, 2020

FLUOROSCOPY TIME:  0 minutes 12 seconds; 3 acquired images

CLINICAL DATA: Open reduction internal fixation for fracture

EXAM:
RIGHT ANKLE - 3 VIEW; DG C-ARM 1-60 MIN

[Series 1: ortho standard · 1 of 1 slices shown (1 of 3)]
[im 1/1]
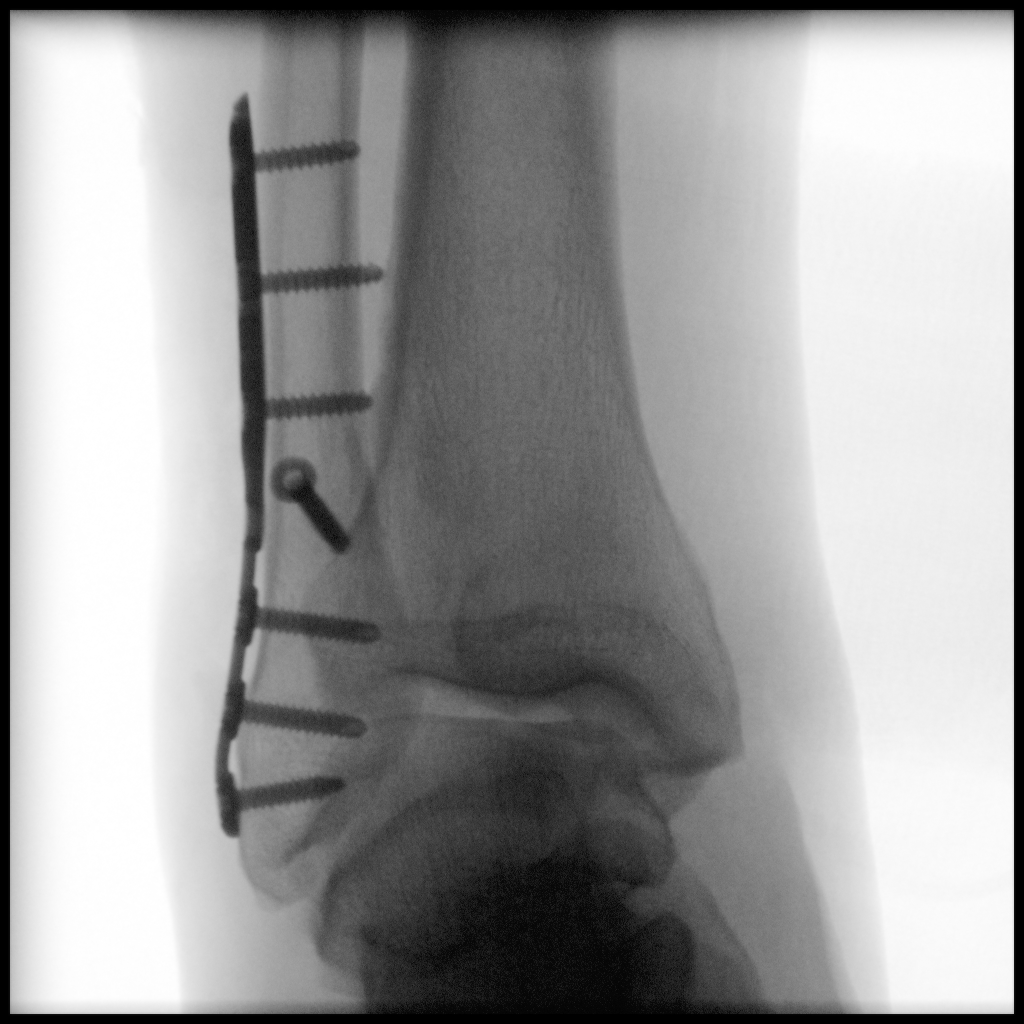

[Series 2: ortho standard · 1 of 1 slices shown (2 of 3)]
[im 1/1]
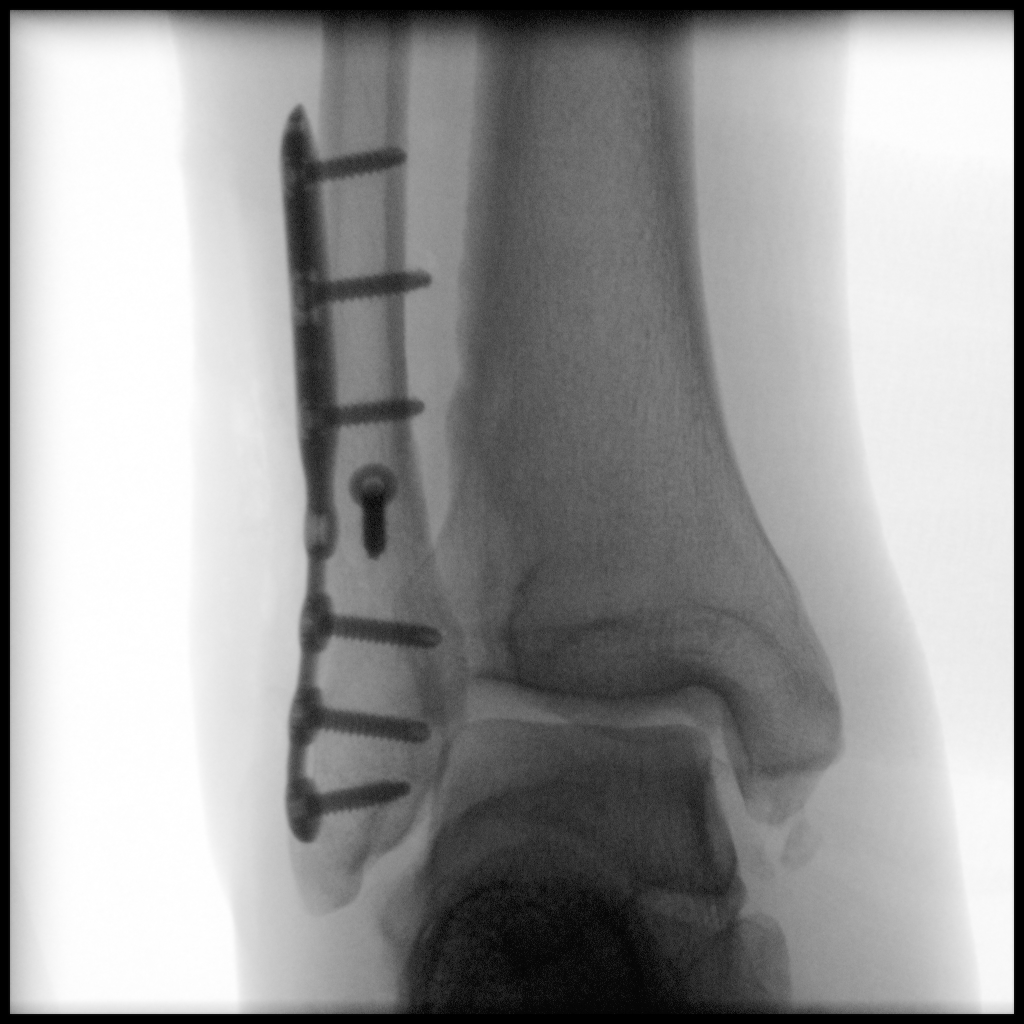

[Series 3: ortho standard · 1 of 1 slices shown (3 of 3)]
[im 1/1]
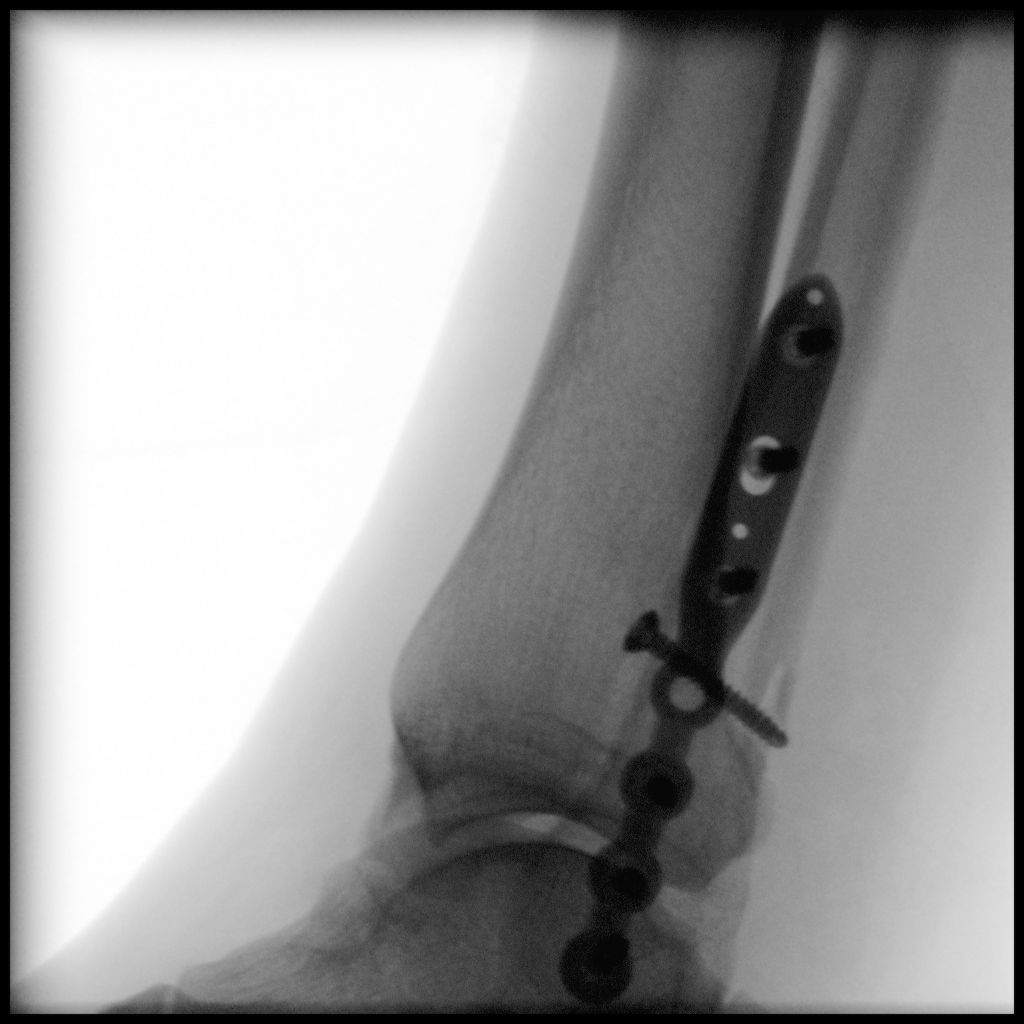

[3 of 3 positions shown; findings below may reference images not displayed]

FINDINGS: Frontal, oblique, and lateral views obtained. There is screw and
plate fixation through a fracture of the distal fibula with
alignment essentially anatomic post fixation. There is an avulsion
fracture arising from the medial malleolus. Ankle mortise appears
grossly intact. No appreciable joint space narrowing.
IMPRESSION: Screw and plate fixation through fracture of the distal fibula with
alignment essentially anatomic in the postoperative area. Avulsion
medial malleolus again noted. Ankle mortise appears intact. No
appreciable underlying arthropathic change.
